# Patient Record
Sex: Female | Born: 1950 | Race: Black or African American | Hispanic: No | Smoking: Former smoker
Health system: Southern US, Community
[De-identification: ages and names within clinical notes are randomized; demographics above are authoritative.]

## PROBLEM LIST (undated history)

## (undated) DIAGNOSIS — M1990 Unspecified osteoarthritis, unspecified site: Secondary | ICD-10-CM

## (undated) DIAGNOSIS — E669 Obesity, unspecified: Secondary | ICD-10-CM

## (undated) DIAGNOSIS — I10 Essential (primary) hypertension: Secondary | ICD-10-CM

## (undated) DIAGNOSIS — H409 Unspecified glaucoma: Secondary | ICD-10-CM

## (undated) HISTORY — PX: COLONOSCOPY: SHX174

## (undated) HISTORY — PX: BREAST BIOPSY: SHX20

## (undated) HISTORY — PX: TONSILLECTOMY: SUR1361

## (undated) HISTORY — PX: APPENDECTOMY: SHX54

## (undated) HISTORY — PX: ABDOMINAL HYSTERECTOMY: SHX81

---

## 2005-12-09 HISTORY — PX: BREAST BIOPSY: SHX20

## 2006-07-07 ENCOUNTER — Ambulatory Visit: Payer: Self-pay | Admitting: Internal Medicine

## 2006-07-09 ENCOUNTER — Ambulatory Visit: Payer: Self-pay

## 2006-09-16 ENCOUNTER — Other Ambulatory Visit: Payer: Self-pay

## 2006-09-19 ENCOUNTER — Ambulatory Visit: Payer: Self-pay | Admitting: Surgery

## 2008-04-05 ENCOUNTER — Ambulatory Visit: Payer: Self-pay

## 2008-04-12 ENCOUNTER — Ambulatory Visit: Payer: Self-pay

## 2009-09-07 ENCOUNTER — Ambulatory Visit: Payer: Self-pay | Admitting: Internal Medicine

## 2009-09-13 ENCOUNTER — Ambulatory Visit: Payer: Self-pay | Admitting: Internal Medicine

## 2012-05-28 ENCOUNTER — Ambulatory Visit: Payer: Self-pay | Admitting: Unknown Physician Specialty

## 2012-05-29 LAB — PATHOLOGY REPORT

## 2015-04-19 DIAGNOSIS — H2513 Age-related nuclear cataract, bilateral: Secondary | ICD-10-CM | POA: Diagnosis not present

## 2015-04-19 DIAGNOSIS — H4011X2 Primary open-angle glaucoma, moderate stage: Secondary | ICD-10-CM | POA: Diagnosis not present

## 2015-05-18 DIAGNOSIS — H4011X2 Primary open-angle glaucoma, moderate stage: Secondary | ICD-10-CM | POA: Diagnosis not present

## 2015-08-23 DIAGNOSIS — R21 Rash and other nonspecific skin eruption: Secondary | ICD-10-CM | POA: Diagnosis not present

## 2015-08-23 DIAGNOSIS — I10 Essential (primary) hypertension: Secondary | ICD-10-CM | POA: Diagnosis not present

## 2015-08-23 DIAGNOSIS — L83 Acanthosis nigricans: Secondary | ICD-10-CM | POA: Diagnosis not present

## 2015-08-30 DIAGNOSIS — R7309 Other abnormal glucose: Secondary | ICD-10-CM | POA: Diagnosis not present

## 2015-08-30 DIAGNOSIS — L83 Acanthosis nigricans: Secondary | ICD-10-CM | POA: Diagnosis not present

## 2015-08-30 DIAGNOSIS — I10 Essential (primary) hypertension: Secondary | ICD-10-CM | POA: Diagnosis not present

## 2015-11-23 DIAGNOSIS — H401111 Primary open-angle glaucoma, right eye, mild stage: Secondary | ICD-10-CM | POA: Diagnosis not present

## 2015-11-23 DIAGNOSIS — H401122 Primary open-angle glaucoma, left eye, moderate stage: Secondary | ICD-10-CM | POA: Diagnosis not present

## 2016-02-14 DIAGNOSIS — I10 Essential (primary) hypertension: Secondary | ICD-10-CM | POA: Diagnosis not present

## 2016-02-14 DIAGNOSIS — E669 Obesity, unspecified: Secondary | ICD-10-CM | POA: Diagnosis not present

## 2016-02-14 DIAGNOSIS — R7301 Impaired fasting glucose: Secondary | ICD-10-CM | POA: Diagnosis not present

## 2016-04-19 DIAGNOSIS — H5210 Myopia, unspecified eye: Secondary | ICD-10-CM | POA: Diagnosis not present

## 2016-04-19 DIAGNOSIS — H524 Presbyopia: Secondary | ICD-10-CM | POA: Diagnosis not present

## 2016-04-22 DIAGNOSIS — H35373 Puckering of macula, bilateral: Secondary | ICD-10-CM | POA: Diagnosis not present

## 2016-08-14 DIAGNOSIS — I10 Essential (primary) hypertension: Secondary | ICD-10-CM | POA: Diagnosis not present

## 2016-08-14 DIAGNOSIS — R7301 Impaired fasting glucose: Secondary | ICD-10-CM | POA: Diagnosis not present

## 2016-08-21 ENCOUNTER — Other Ambulatory Visit: Payer: Self-pay | Admitting: Internal Medicine

## 2016-08-21 DIAGNOSIS — E669 Obesity, unspecified: Secondary | ICD-10-CM | POA: Diagnosis not present

## 2016-08-21 DIAGNOSIS — Z0000 Encounter for general adult medical examination without abnormal findings: Secondary | ICD-10-CM | POA: Diagnosis not present

## 2016-08-21 DIAGNOSIS — H401111 Primary open-angle glaucoma, right eye, mild stage: Secondary | ICD-10-CM | POA: Diagnosis not present

## 2016-08-21 DIAGNOSIS — H401122 Primary open-angle glaucoma, left eye, moderate stage: Secondary | ICD-10-CM | POA: Diagnosis not present

## 2016-08-21 DIAGNOSIS — R922 Inconclusive mammogram: Secondary | ICD-10-CM

## 2016-08-21 DIAGNOSIS — H409 Unspecified glaucoma: Secondary | ICD-10-CM | POA: Diagnosis not present

## 2016-08-21 DIAGNOSIS — Z23 Encounter for immunization: Secondary | ICD-10-CM | POA: Diagnosis not present

## 2016-08-21 DIAGNOSIS — R739 Hyperglycemia, unspecified: Secondary | ICD-10-CM | POA: Diagnosis not present

## 2016-08-21 DIAGNOSIS — I10 Essential (primary) hypertension: Secondary | ICD-10-CM | POA: Diagnosis not present

## 2016-09-12 ENCOUNTER — Ambulatory Visit: Payer: Self-pay | Attending: Internal Medicine

## 2016-09-25 ENCOUNTER — Other Ambulatory Visit: Payer: Self-pay

## 2016-10-07 ENCOUNTER — Encounter: Payer: Self-pay | Admitting: Radiology

## 2016-10-07 ENCOUNTER — Ambulatory Visit
Admission: RE | Admit: 2016-10-07 | Discharge: 2016-10-07 | Disposition: A | Discharge status: Home / Self Care | Payer: Commercial Managed Care - HMO | Source: Ambulatory Visit | Attending: Internal Medicine | Admitting: Internal Medicine

## 2016-10-07 DIAGNOSIS — R922 Inconclusive mammogram: Secondary | ICD-10-CM | POA: Diagnosis not present

## 2016-10-07 DIAGNOSIS — N6012 Diffuse cystic mastopathy of left breast: Secondary | ICD-10-CM | POA: Diagnosis not present

## 2016-10-07 DIAGNOSIS — N6322 Unspecified lump in the left breast, upper inner quadrant: Secondary | ICD-10-CM | POA: Diagnosis not present

## 2017-02-19 DIAGNOSIS — I10 Essential (primary) hypertension: Secondary | ICD-10-CM | POA: Diagnosis not present

## 2017-02-19 DIAGNOSIS — H401111 Primary open-angle glaucoma, right eye, mild stage: Secondary | ICD-10-CM | POA: Diagnosis not present

## 2017-02-19 DIAGNOSIS — R739 Hyperglycemia, unspecified: Secondary | ICD-10-CM | POA: Diagnosis not present

## 2017-02-19 DIAGNOSIS — Z0000 Encounter for general adult medical examination without abnormal findings: Secondary | ICD-10-CM | POA: Diagnosis not present

## 2017-02-19 DIAGNOSIS — H401122 Primary open-angle glaucoma, left eye, moderate stage: Secondary | ICD-10-CM | POA: Diagnosis not present

## 2017-02-26 DIAGNOSIS — R739 Hyperglycemia, unspecified: Secondary | ICD-10-CM | POA: Diagnosis not present

## 2017-02-26 DIAGNOSIS — Z6834 Body mass index (BMI) 34.0-34.9, adult: Secondary | ICD-10-CM | POA: Diagnosis not present

## 2017-02-26 DIAGNOSIS — I10 Essential (primary) hypertension: Secondary | ICD-10-CM | POA: Diagnosis not present

## 2017-07-16 DIAGNOSIS — H401122 Primary open-angle glaucoma, left eye, moderate stage: Secondary | ICD-10-CM | POA: Diagnosis not present

## 2017-07-16 DIAGNOSIS — H524 Presbyopia: Secondary | ICD-10-CM | POA: Diagnosis not present

## 2017-09-24 DIAGNOSIS — Z1231 Encounter for screening mammogram for malignant neoplasm of breast: Secondary | ICD-10-CM | POA: Diagnosis not present

## 2017-09-24 DIAGNOSIS — Z23 Encounter for immunization: Secondary | ICD-10-CM | POA: Diagnosis not present

## 2017-09-24 DIAGNOSIS — Z0000 Encounter for general adult medical examination without abnormal findings: Secondary | ICD-10-CM | POA: Diagnosis not present

## 2017-09-24 DIAGNOSIS — Z6834 Body mass index (BMI) 34.0-34.9, adult: Secondary | ICD-10-CM | POA: Diagnosis not present

## 2017-09-24 DIAGNOSIS — I10 Essential (primary) hypertension: Secondary | ICD-10-CM | POA: Diagnosis not present

## 2017-09-24 DIAGNOSIS — R739 Hyperglycemia, unspecified: Secondary | ICD-10-CM | POA: Diagnosis not present

## 2017-12-17 DIAGNOSIS — H401122 Primary open-angle glaucoma, left eye, moderate stage: Secondary | ICD-10-CM | POA: Diagnosis not present

## 2017-12-17 DIAGNOSIS — H401111 Primary open-angle glaucoma, right eye, mild stage: Secondary | ICD-10-CM | POA: Diagnosis not present

## 2018-03-18 DIAGNOSIS — I10 Essential (primary) hypertension: Secondary | ICD-10-CM | POA: Diagnosis not present

## 2018-03-18 DIAGNOSIS — R7301 Impaired fasting glucose: Secondary | ICD-10-CM | POA: Diagnosis not present

## 2018-03-18 DIAGNOSIS — Z6834 Body mass index (BMI) 34.0-34.9, adult: Secondary | ICD-10-CM | POA: Diagnosis not present

## 2018-04-15 DIAGNOSIS — H401111 Primary open-angle glaucoma, right eye, mild stage: Secondary | ICD-10-CM | POA: Diagnosis not present

## 2018-04-15 DIAGNOSIS — H401122 Primary open-angle glaucoma, left eye, moderate stage: Secondary | ICD-10-CM | POA: Diagnosis not present

## 2018-08-19 DIAGNOSIS — H401122 Primary open-angle glaucoma, left eye, moderate stage: Secondary | ICD-10-CM | POA: Diagnosis not present

## 2018-08-19 DIAGNOSIS — H5203 Hypermetropia, bilateral: Secondary | ICD-10-CM | POA: Diagnosis not present

## 2018-08-27 DIAGNOSIS — S39012A Strain of muscle, fascia and tendon of lower back, initial encounter: Secondary | ICD-10-CM | POA: Diagnosis not present

## 2018-08-27 DIAGNOSIS — W03XXXA Other fall on same level due to collision with another person, initial encounter: Secondary | ICD-10-CM | POA: Diagnosis not present

## 2018-09-23 DIAGNOSIS — R7301 Impaired fasting glucose: Secondary | ICD-10-CM | POA: Diagnosis not present

## 2018-09-23 DIAGNOSIS — I10 Essential (primary) hypertension: Secondary | ICD-10-CM | POA: Diagnosis not present

## 2018-09-30 DIAGNOSIS — Z0000 Encounter for general adult medical examination without abnormal findings: Secondary | ICD-10-CM | POA: Diagnosis not present

## 2018-09-30 DIAGNOSIS — Z23 Encounter for immunization: Secondary | ICD-10-CM | POA: Diagnosis not present

## 2018-09-30 DIAGNOSIS — R739 Hyperglycemia, unspecified: Secondary | ICD-10-CM | POA: Diagnosis not present

## 2018-09-30 DIAGNOSIS — I10 Essential (primary) hypertension: Secondary | ICD-10-CM | POA: Diagnosis not present

## 2018-10-20 DIAGNOSIS — M545 Low back pain: Secondary | ICD-10-CM | POA: Diagnosis not present

## 2018-10-26 DIAGNOSIS — H33321 Round hole, right eye: Secondary | ICD-10-CM | POA: Diagnosis not present

## 2018-11-18 DIAGNOSIS — M545 Low back pain: Secondary | ICD-10-CM | POA: Diagnosis not present

## 2018-11-18 DIAGNOSIS — S32020D Wedge compression fracture of second lumbar vertebra, subsequent encounter for fracture with routine healing: Secondary | ICD-10-CM | POA: Diagnosis not present

## 2018-11-26 DIAGNOSIS — M545 Low back pain: Secondary | ICD-10-CM | POA: Diagnosis not present

## 2018-11-26 DIAGNOSIS — S32020D Wedge compression fracture of second lumbar vertebra, subsequent encounter for fracture with routine healing: Secondary | ICD-10-CM | POA: Diagnosis not present

## 2018-12-10 DIAGNOSIS — S32020D Wedge compression fracture of second lumbar vertebra, subsequent encounter for fracture with routine healing: Secondary | ICD-10-CM | POA: Diagnosis not present

## 2018-12-10 DIAGNOSIS — M545 Low back pain: Secondary | ICD-10-CM | POA: Diagnosis not present

## 2018-12-17 DIAGNOSIS — S32020D Wedge compression fracture of second lumbar vertebra, subsequent encounter for fracture with routine healing: Secondary | ICD-10-CM | POA: Diagnosis not present

## 2018-12-17 DIAGNOSIS — H401122 Primary open-angle glaucoma, left eye, moderate stage: Secondary | ICD-10-CM | POA: Diagnosis not present

## 2018-12-17 DIAGNOSIS — M545 Low back pain: Secondary | ICD-10-CM | POA: Diagnosis not present

## 2018-12-17 DIAGNOSIS — H401111 Primary open-angle glaucoma, right eye, mild stage: Secondary | ICD-10-CM | POA: Diagnosis not present

## 2018-12-23 DIAGNOSIS — S32020D Wedge compression fracture of second lumbar vertebra, subsequent encounter for fracture with routine healing: Secondary | ICD-10-CM | POA: Diagnosis not present

## 2018-12-23 DIAGNOSIS — M545 Low back pain: Secondary | ICD-10-CM | POA: Diagnosis not present

## 2019-01-01 DIAGNOSIS — S32020D Wedge compression fracture of second lumbar vertebra, subsequent encounter for fracture with routine healing: Secondary | ICD-10-CM | POA: Diagnosis not present

## 2019-01-01 DIAGNOSIS — M545 Low back pain: Secondary | ICD-10-CM | POA: Diagnosis not present

## 2019-01-06 DIAGNOSIS — M545 Low back pain: Secondary | ICD-10-CM | POA: Diagnosis not present

## 2019-01-06 DIAGNOSIS — S32020D Wedge compression fracture of second lumbar vertebra, subsequent encounter for fracture with routine healing: Secondary | ICD-10-CM | POA: Diagnosis not present

## 2019-01-13 DIAGNOSIS — S32020D Wedge compression fracture of second lumbar vertebra, subsequent encounter for fracture with routine healing: Secondary | ICD-10-CM | POA: Diagnosis not present

## 2019-01-13 DIAGNOSIS — M545 Low back pain: Secondary | ICD-10-CM | POA: Diagnosis not present

## 2019-01-20 DIAGNOSIS — M545 Low back pain: Secondary | ICD-10-CM | POA: Diagnosis not present

## 2019-01-20 DIAGNOSIS — S32020D Wedge compression fracture of second lumbar vertebra, subsequent encounter for fracture with routine healing: Secondary | ICD-10-CM | POA: Diagnosis not present

## 2019-03-17 DIAGNOSIS — I10 Essential (primary) hypertension: Secondary | ICD-10-CM | POA: Diagnosis not present

## 2019-04-21 DIAGNOSIS — H35373 Puckering of macula, bilateral: Secondary | ICD-10-CM | POA: Diagnosis not present

## 2019-04-21 DIAGNOSIS — H401111 Primary open-angle glaucoma, right eye, mild stage: Secondary | ICD-10-CM | POA: Diagnosis not present

## 2019-04-21 DIAGNOSIS — H401122 Primary open-angle glaucoma, left eye, moderate stage: Secondary | ICD-10-CM | POA: Diagnosis not present

## 2019-08-18 DIAGNOSIS — H524 Presbyopia: Secondary | ICD-10-CM | POA: Diagnosis not present

## 2019-08-18 DIAGNOSIS — H401122 Primary open-angle glaucoma, left eye, moderate stage: Secondary | ICD-10-CM | POA: Diagnosis not present

## 2019-09-29 DIAGNOSIS — R739 Hyperglycemia, unspecified: Secondary | ICD-10-CM | POA: Diagnosis not present

## 2019-09-29 DIAGNOSIS — Z6834 Body mass index (BMI) 34.0-34.9, adult: Secondary | ICD-10-CM | POA: Diagnosis not present

## 2019-09-29 DIAGNOSIS — I10 Essential (primary) hypertension: Secondary | ICD-10-CM | POA: Diagnosis not present

## 2019-10-06 DIAGNOSIS — R7303 Prediabetes: Secondary | ICD-10-CM | POA: Diagnosis not present

## 2019-10-06 DIAGNOSIS — Z87891 Personal history of nicotine dependence: Secondary | ICD-10-CM | POA: Diagnosis not present

## 2019-10-06 DIAGNOSIS — I10 Essential (primary) hypertension: Secondary | ICD-10-CM | POA: Diagnosis not present

## 2019-10-06 DIAGNOSIS — Z0000 Encounter for general adult medical examination without abnormal findings: Secondary | ICD-10-CM | POA: Diagnosis not present

## 2019-11-08 ENCOUNTER — Other Ambulatory Visit: Payer: Self-pay | Admitting: Internal Medicine

## 2019-11-08 DIAGNOSIS — Z1231 Encounter for screening mammogram for malignant neoplasm of breast: Secondary | ICD-10-CM

## 2019-11-17 ENCOUNTER — Ambulatory Visit
Admission: RE | Admit: 2019-11-17 | Discharge: 2019-11-17 | Disposition: A | Discharge status: Home / Self Care | Payer: Medicare HMO | Source: Ambulatory Visit | Attending: Internal Medicine | Admitting: Internal Medicine

## 2019-11-17 DIAGNOSIS — Z1231 Encounter for screening mammogram for malignant neoplasm of breast: Secondary | ICD-10-CM | POA: Diagnosis not present

## 2019-11-18 ENCOUNTER — Other Ambulatory Visit: Payer: Self-pay | Admitting: Internal Medicine

## 2019-11-18 DIAGNOSIS — N6310 Unspecified lump in the right breast, unspecified quadrant: Secondary | ICD-10-CM

## 2019-11-18 DIAGNOSIS — R928 Other abnormal and inconclusive findings on diagnostic imaging of breast: Secondary | ICD-10-CM

## 2019-12-06 ENCOUNTER — Ambulatory Visit
Admission: RE | Admit: 2019-12-06 | Discharge: 2019-12-06 | Disposition: A | Discharge status: Home / Self Care | Payer: Medicare HMO | Source: Ambulatory Visit | Attending: Internal Medicine | Admitting: Internal Medicine

## 2019-12-06 DIAGNOSIS — N6001 Solitary cyst of right breast: Secondary | ICD-10-CM | POA: Diagnosis not present

## 2019-12-06 DIAGNOSIS — R928 Other abnormal and inconclusive findings on diagnostic imaging of breast: Secondary | ICD-10-CM | POA: Insufficient documentation

## 2019-12-06 DIAGNOSIS — N6310 Unspecified lump in the right breast, unspecified quadrant: Secondary | ICD-10-CM

## 2019-12-06 DIAGNOSIS — N6313 Unspecified lump in the right breast, lower outer quadrant: Secondary | ICD-10-CM | POA: Diagnosis not present

## 2019-12-09 ENCOUNTER — Other Ambulatory Visit: Payer: Self-pay | Admitting: Internal Medicine

## 2019-12-09 DIAGNOSIS — R928 Other abnormal and inconclusive findings on diagnostic imaging of breast: Secondary | ICD-10-CM

## 2019-12-09 DIAGNOSIS — N6310 Unspecified lump in the right breast, unspecified quadrant: Secondary | ICD-10-CM

## 2019-12-14 ENCOUNTER — Ambulatory Visit
Admission: RE | Admit: 2019-12-14 | Discharge: 2019-12-14 | Disposition: A | Discharge status: Home / Self Care | Payer: Medicare HMO | Source: Ambulatory Visit | Attending: Internal Medicine | Admitting: Internal Medicine

## 2019-12-14 DIAGNOSIS — R928 Other abnormal and inconclusive findings on diagnostic imaging of breast: Secondary | ICD-10-CM | POA: Diagnosis not present

## 2019-12-14 DIAGNOSIS — N6081 Other benign mammary dysplasias of right breast: Secondary | ICD-10-CM | POA: Diagnosis not present

## 2019-12-14 DIAGNOSIS — N6311 Unspecified lump in the right breast, upper outer quadrant: Secondary | ICD-10-CM | POA: Diagnosis not present

## 2019-12-14 DIAGNOSIS — N6310 Unspecified lump in the right breast, unspecified quadrant: Secondary | ICD-10-CM | POA: Insufficient documentation

## 2019-12-14 HISTORY — PX: BREAST BIOPSY: SHX20

## 2019-12-15 LAB — SURGICAL PATHOLOGY

## 2020-01-21 ENCOUNTER — Ambulatory Visit: Payer: Medicare HMO | Attending: Internal Medicine

## 2020-01-21 DIAGNOSIS — R112 Nausea with vomiting, unspecified: Secondary | ICD-10-CM | POA: Diagnosis not present

## 2020-01-21 DIAGNOSIS — Z9889 Other specified postprocedural states: Secondary | ICD-10-CM | POA: Diagnosis not present

## 2020-01-21 DIAGNOSIS — Z23 Encounter for immunization: Secondary | ICD-10-CM

## 2020-01-21 DIAGNOSIS — Z86010 Personal history of colonic polyps: Secondary | ICD-10-CM | POA: Diagnosis not present

## 2020-01-21 NOTE — Progress Notes (Signed)
   Covid-19 Vaccination Clinic  Name:  Alejandra Villanueva    MRN: [id number] DOB: 09/14/1951  01/21/2020  [name] was observed post Covid-19 immunization for 15 minutes without incidence. She was provided with Vaccine Information Sheet and instruction to access the V-Safe system.   [name] was instructed to call 911 with any severe reactions post vaccine: Marland Kitchen Difficulty breathing  . Swelling of your face and throat  . A fast heartbeat  . A bad rash all over your body  . Dizziness and weakness    Immunizations Administered    Name Date Dose VIS Date Route   Pfizer COVID-19 Vaccine 01/21/2020  9:57 AM 0.3 mL 11/19/2019 Intramuscular   Manufacturer: [place]   Lot: [id number]   [place]: [id number]

## 2020-02-15 ENCOUNTER — Ambulatory Visit: Payer: Medicare HMO | Attending: Internal Medicine

## 2020-02-15 DIAGNOSIS — Z23 Encounter for immunization: Secondary | ICD-10-CM | POA: Insufficient documentation

## 2020-02-15 NOTE — Progress Notes (Signed)
   Covid-19 Vaccination Clinic  Name:  Alejandra Villanueva    MRN: [id number] DOB: March 06, 1951  02/15/2020  [name] was observed post Covid-19 immunization for 15 minutes without incident. She was provided with Vaccine Information Sheet and instruction to access the V-Safe system.   [name] was instructed to call 911 with any severe reactions post vaccine: Marland Kitchen Difficulty breathing  . Swelling of face and throat  . A fast heartbeat  . A bad rash all over body  . Dizziness and weakness   Immunizations Administered    Name Date Dose VIS Date Route   Pfizer COVID-19 Vaccine 02/15/2020 11:07 AM 0.3 mL 11/19/2019 Intramuscular   Manufacturer: [place]   Lot: [id number]   [place]: [id number]

## 2020-03-29 DIAGNOSIS — R7303 Prediabetes: Secondary | ICD-10-CM | POA: Diagnosis not present

## 2020-03-29 DIAGNOSIS — I10 Essential (primary) hypertension: Secondary | ICD-10-CM | POA: Diagnosis not present

## 2020-04-05 DIAGNOSIS — Z87891 Personal history of nicotine dependence: Secondary | ICD-10-CM | POA: Diagnosis not present

## 2020-04-05 DIAGNOSIS — R7303 Prediabetes: Secondary | ICD-10-CM | POA: Diagnosis not present

## 2020-04-05 DIAGNOSIS — I10 Essential (primary) hypertension: Secondary | ICD-10-CM | POA: Diagnosis not present

## 2020-05-18 ENCOUNTER — Other Ambulatory Visit
Admission: RE | Admit: 2020-05-18 | Discharge: 2020-05-18 | Disposition: A | Discharge status: Home / Self Care | Payer: Medicare HMO | Source: Ambulatory Visit | Attending: Internal Medicine | Admitting: Internal Medicine

## 2020-05-18 ENCOUNTER — Other Ambulatory Visit: Payer: Self-pay

## 2020-05-18 DIAGNOSIS — Z01812 Encounter for preprocedural laboratory examination: Secondary | ICD-10-CM | POA: Insufficient documentation

## 2020-05-18 DIAGNOSIS — Z20822 Contact with and (suspected) exposure to covid-19: Secondary | ICD-10-CM | POA: Insufficient documentation

## 2020-05-19 ENCOUNTER — Encounter: Payer: Self-pay | Admitting: Internal Medicine

## 2020-05-19 LAB — SARS CORONAVIRUS 2 (TAT 6-24 HRS): SARS Coronavirus 2: NEGATIVE

## 2020-05-22 ENCOUNTER — Encounter
Admission: RE | Disposition: A | Discharge status: Home / Self Care | Payer: Self-pay | Source: Home / Self Care | Attending: Internal Medicine

## 2020-05-22 ENCOUNTER — Encounter: Payer: Self-pay | Admitting: Internal Medicine

## 2020-05-22 ENCOUNTER — Other Ambulatory Visit: Payer: Self-pay

## 2020-05-22 ENCOUNTER — Ambulatory Visit: Payer: Medicare HMO | Admitting: Anesthesiology

## 2020-05-22 ENCOUNTER — Ambulatory Visit
Admission: RE | Admit: 2020-05-22 | Discharge: 2020-05-22 | Disposition: A | Discharge status: Home / Self Care | Payer: Medicare HMO | Attending: Internal Medicine | Admitting: Internal Medicine

## 2020-05-22 DIAGNOSIS — Z1211 Encounter for screening for malignant neoplasm of colon: Secondary | ICD-10-CM | POA: Diagnosis not present

## 2020-05-22 DIAGNOSIS — Z888 Allergy status to other drugs, medicaments and biological substances status: Secondary | ICD-10-CM | POA: Diagnosis not present

## 2020-05-22 DIAGNOSIS — K621 Rectal polyp: Secondary | ICD-10-CM | POA: Insufficient documentation

## 2020-05-22 DIAGNOSIS — M1990 Unspecified osteoarthritis, unspecified site: Secondary | ICD-10-CM | POA: Diagnosis not present

## 2020-05-22 DIAGNOSIS — Z79899 Other long term (current) drug therapy: Secondary | ICD-10-CM | POA: Diagnosis not present

## 2020-05-22 DIAGNOSIS — K648 Other hemorrhoids: Secondary | ICD-10-CM | POA: Diagnosis not present

## 2020-05-22 DIAGNOSIS — K5730 Diverticulosis of large intestine without perforation or abscess without bleeding: Secondary | ICD-10-CM | POA: Diagnosis not present

## 2020-05-22 DIAGNOSIS — Z87891 Personal history of nicotine dependence: Secondary | ICD-10-CM | POA: Diagnosis not present

## 2020-05-22 DIAGNOSIS — K6389 Other specified diseases of intestine: Secondary | ICD-10-CM | POA: Insufficient documentation

## 2020-05-22 DIAGNOSIS — Z86010 Personal history of colonic polyps: Secondary | ICD-10-CM | POA: Diagnosis not present

## 2020-05-22 DIAGNOSIS — D128 Benign neoplasm of rectum: Secondary | ICD-10-CM | POA: Diagnosis not present

## 2020-05-22 DIAGNOSIS — E669 Obesity, unspecified: Secondary | ICD-10-CM | POA: Insufficient documentation

## 2020-05-22 DIAGNOSIS — K640 First degree hemorrhoids: Secondary | ICD-10-CM | POA: Diagnosis not present

## 2020-05-22 DIAGNOSIS — I10 Essential (primary) hypertension: Secondary | ICD-10-CM | POA: Insufficient documentation

## 2020-05-22 DIAGNOSIS — H409 Unspecified glaucoma: Secondary | ICD-10-CM | POA: Diagnosis not present

## 2020-05-22 DIAGNOSIS — Z885 Allergy status to narcotic agent status: Secondary | ICD-10-CM | POA: Insufficient documentation

## 2020-05-22 HISTORY — DX: Unspecified osteoarthritis, unspecified site: M19.90

## 2020-05-22 HISTORY — DX: Obesity, unspecified: E66.9

## 2020-05-22 HISTORY — DX: Essential (primary) hypertension: I10

## 2020-05-22 HISTORY — PX: COLONOSCOPY WITH PROPOFOL: SHX5780

## 2020-05-22 HISTORY — DX: Unspecified glaucoma: H40.9

## 2020-05-22 SURGERY — COLONOSCOPY WITH PROPOFOL
Anesthesia: General

## 2020-05-22 MED ORDER — PROPOFOL 500 MG/50ML IV EMUL
INTRAVENOUS | Status: AC
  Filled 2020-05-22: qty 50

## 2020-05-22 MED ORDER — LIDOCAINE HCL (CARDIAC) PF 100 MG/5ML IV SOSY
PREFILLED_SYRINGE | INTRAVENOUS | Status: DC | PRN
  Administered 2020-05-22: 60 mg via INTRATRACHEAL

## 2020-05-22 MED ORDER — SODIUM CHLORIDE 0.9 % IV SOLN: INTRAVENOUS | Status: DC

## 2020-05-22 MED ORDER — PROPOFOL 500 MG/50ML IV EMUL
INTRAVENOUS | Status: DC | PRN
  Administered 2020-05-22: 150 ug/kg/min via INTRAVENOUS

## 2020-05-22 MED ORDER — LIDOCAINE HCL (PF) 2 % IJ SOLN
INTRAMUSCULAR | Status: AC
  Filled 2020-05-22: qty 10

## 2020-05-22 MED ORDER — PROPOFOL 10 MG/ML IV BOLUS
INTRAVENOUS | Status: DC | PRN
  Administered 2020-05-22: 10 mg via INTRAVENOUS
  Administered 2020-05-22: 20 mg via INTRAVENOUS
  Administered 2020-05-22: 10 mg via INTRAVENOUS
  Administered 2020-05-22: 50 mg via INTRAVENOUS

## 2020-05-22 NOTE — Transfer of Care (Signed)
Immediate Anesthesia Transfer of Care Note  Patient: Alejandra Villanueva  Procedure(s) Performed: COLONOSCOPY WITH PROPOFOL (N/A )  Patient Location: PACU and Endoscopy Unit  Anesthesia Type:General  Level of Consciousness: sedated  Airway & Oxygen Therapy: Patient Spontanous Breathing  Post-op Assessment: Report given to RN and Post -op Vital signs reviewed and stable  Post vital signs: Reviewed and stable  Last Vitals:  Vitals Value Taken Time  BP    Temp    Pulse    Resp    SpO2      Last Pain:  Vitals:   05/22/20 1013  TempSrc: Temporal  PainSc: 0-No pain         Complications: No complications documented.

## 2020-05-22 NOTE — Op Note (Signed)
[facility] Patient Name: Alejandra Villanueva Procedure Date: 05/22/2020 11:05 AM MRN: [id number] Account #: [id number] Date of Birth: 07/02/1951 Admit Type: Outpatient Age: 69 Room: [facility] ENDO ROOM 3 Gender: Female Note Status: Finalized Procedure:             Colonoscopy Indications:           Surveillance: Personal history of adenomatous polyps                         on last colonoscopy > 5 years ago Providers:             Lorie Apley K. Alice Reichert MD, MD Referring MD:          Frazier Richards III, M.D. Medicines:             Propofol per Anesthesia Complications:         No immediate complications. Procedure:             Pre-Anesthesia Assessment:                        - The risks and benefits of the procedure and the                         sedation options and risks were discussed with the                         patient. All questions were answered and informed                         consent was obtained.                        - Patient identification and proposed procedure were                         verified prior to the procedure by the nurse. The                         procedure was verified in the procedure room.                        - ASA Grade Assessment: III - A patient with severe                         systemic disease.                        - After reviewing the risks and benefits, the patient                         was deemed in satisfactory condition to undergo the                         procedure.                        After obtaining informed consent, the colonoscope was                         passed under direct vision. Throughout  the procedure,                         the patient's blood pressure, pulse, and oxygen                         saturations were monitored continuously. The                         Colonoscope was introduced through the anus and                         advanced to the the cecum, identified by  appendiceal                         orifice and ileocecal valve. The colonoscopy was                         somewhat difficult due to significant looping.                         Successful completion of the procedure was aided by                         changing the patient to a prone position. The patient                         tolerated the procedure fairly well. The quality of                         the bowel preparation was adequate. The ileocecal                         valve, appendiceal orifice, and rectum were                         photographed. Findings:      The perianal and digital rectal examinations were normal. Pertinent       negatives include normal sphincter tone and no palpable rectal lesions.      Many small and large-mouthed diverticula were found in the sigmoid colon.      A 4 mm polyp was found in the rectum. The polyp was sessile. The polyp       was removed with a jumbo cold forceps. Resection and retrieval were       complete.      A diffuse area of mild melanosis was found in the entire colon.      Non-bleeding internal hemorrhoids were found during retroflexion. The       hemorrhoids were Grade I (internal hemorrhoids that do not prolapse).      The exam was otherwise without abnormality. Impression:            - Diverticulosis in the sigmoid colon.                        - One 4 mm polyp in the rectum, removed with a jumbo                         cold forceps. Resected and retrieved.                        -  Melanosis in the colon.                        - Non-bleeding internal hemorrhoids.                        - The examination was otherwise normal. Recommendation:        - Patient has a contact number available for                         emergencies. The signs and symptoms of potential                         delayed complications were discussed with the patient.                         Return to normal activities tomorrow. Written                          discharge instructions were provided to the patient.                        - Resume previous diet.                        - Continue present medications.                        - Await pathology results.                        - Repeat colonoscopy in 5 years for surveillance.                        - Return to GI office PRN.                        - The findings and recommendations were discussed with                         the patient. Procedure Code(s):     --- Professional ---                        [phone], Colonoscopy, flexible; with biopsy, single or                         multiple Diagnosis Code(s):     --- Professional ---                        K57.30, Diverticulosis of large intestine without                         perforation or abscess without bleeding                        K63.89, Other specified diseases of intestine                        K62.1, Rectal polyp  K64.0, First degree hemorrhoids                        Z86.010, Personal history of colonic polyps CPT copyright 2019 American Medical Association. All rights reserved. The codes documented in this report are preliminary and upon coder review may  be revised to meet current compliance requirements. Attending Participation:      I personally performed the entire procedure. Efrain Sella MD, MD 05/22/2020 11:48:25 AM This report has been signed electronically. Number of Addenda: 0 Note Initiated On: 05/22/2020 11:05 AM Scope Withdrawal Time: 0 hours 7 minutes 31 seconds  Total Procedure Duration: 0 hours 18 minutes 36 seconds  Estimated Blood Loss:  Estimated blood loss: none.      [facility]

## 2020-05-22 NOTE — Anesthesia Postprocedure Evaluation (Signed)
Anesthesia Post Note  Patient: Alejandra Villanueva  Procedure(s) Performed: COLONOSCOPY WITH PROPOFOL (N/A )  Patient location during evaluation: Endoscopy Anesthesia Type: General Level of consciousness: awake and alert Pain management: pain level controlled Vital Signs Assessment: post-procedure vital signs reviewed and stable Respiratory status: spontaneous breathing and respiratory function stable Cardiovascular status: stable Anesthetic complications: no   No complications documented.   Last Vitals:  Vitals:   05/22/20 1157 05/22/20 1207  BP: 123/87 139/85  Pulse: 80 69  Resp: 20 (!) 23  Temp:    SpO2: 99% 100%    Last Pain:  Vitals:   05/22/20 1207  TempSrc:   PainSc: 0-No pain                 Alejandra Villanueva K

## 2020-05-22 NOTE — Interval H&P Note (Signed)
History and Physical Interval Note:  05/22/2020 11:08 AM  Alejandra Villanueva  has presented today for surgery, with the diagnosis of PERSONAL HX.OF COLON POLYPS.  The various methods of treatment have been discussed with the patient and family. After consideration of risks, benefits and other options for treatment, the patient has consented to  Procedure(s): COLONOSCOPY WITH PROPOFOL (N/A) as a surgical intervention.  The patient's history has been reviewed, patient examined, no change in status, stable for surgery.  I have reviewed the patient's chart and labs.  Questions were answered to the patient's satisfaction.     [place], [place]

## 2020-05-22 NOTE — H&P (Signed)
Outpatient short stay form Pre-procedure 05/22/2020 11:07 AM Alejandra Villanueva K. Alice Reichert, M.D.  Primary Physician: Frazier Richards III, M.D.  Reason for visit:  Personal hx of multiple adenomatous colon polyps - 2013.  History of present illness:                           Patient presents for colonoscopy for a personal hx of colon polyps. The patient denies abdominal pain, abnormal weight loss or rectal bleeding.      Current Facility-Administered Medications:  .  0.9 %  sodium chloride infusion, , Intravenous, Continuous, [place], Benay Pike, MD, Last Rate: 20 mL/hr at 05/22/20 1043, New Bag at 05/22/20 1043  Medications Prior to Admission  Medication Sig Dispense Refill Last Dose  . amLODipine (NORVASC) 10 MG tablet Take 10 mg by mouth daily.   05/22/2020 at Unknown time  . latanoprost (XALATAN) 0.005 % ophthalmic solution Place 1 drop into both eyes at bedtime.   05/21/2020 at Unknown time  . ondansetron (ZOFRAN) 4 MG tablet Take 4 mg by mouth every 8 (eight) hours as needed for nausea or vomiting.     . timolol (TIMOPTIC) 0.5 % ophthalmic solution Place 1 drop into both eyes 2 (two) times daily.   05/21/2020 at Unknown time     Allergies  Allergen Reactions  . Codeine Nausea Only  . Tramadol Other (See Comments)    hallucination     Past Medical History:  Diagnosis Date  . Arthritis   . Glaucoma   . Hypertension   . Obesity     Review of systems:  Otherwise negative.    Physical Exam  Gen: Alert, oriented. Appears stated age.  HEENT: [place]/AT. PERRLA. Lungs: CTA, no wheezes. CV: RR nl S1, S2. Abd: soft, benign, no masses. BS+ Ext: No edema. Pulses 2+    Planned procedures: Proceed with colonoscopy. The patient understands the nature of the planned procedure, indications, risks, alternatives and potential complications including but not limited to bleeding, infection, perforation, damage to internal organs and possible oversedation/side effects from anesthesia. The patient  agrees and gives consent to proceed.  Please refer to procedure notes for findings, recommendations and patient disposition/instructions.     Ionna Avis K. Alice Reichert, M.D. Gastroenterology 05/22/2020  11:07 AM

## 2020-05-22 NOTE — Anesthesia Preprocedure Evaluation (Signed)
Anesthesia Evaluation  Patient identified by MRN, date of birth, ID band Patient awake    Reviewed: Allergy & Precautions, NPO status , Patient's Chart, lab work & pertinent test results  History of Anesthesia Complications Negative for: history of anesthetic complications  Airway Mallampati: III       Dental   Pulmonary neg sleep apnea, neg COPD, Not current smoker, former smoker,           Cardiovascular hypertension, Pt. on medications (-) Past MI and (-) CHF (-) dysrhythmias (-) Valvular Problems/Murmurs     Neuro/Psych neg Seizures    GI/Hepatic Neg liver ROS, neg GERD  ,  Endo/Other  neg diabetes  Renal/GU negative Renal ROS     Musculoskeletal   Abdominal   Peds  Hematology   Anesthesia Other Findings   Reproductive/Obstetrics                             Anesthesia Physical Anesthesia Plan  ASA: II  Anesthesia Plan: General   Post-op Pain Management:    Induction: Intravenous  PONV Risk Score and Plan: 3 and Propofol infusion, TIVA and Treatment may vary due to age or medical condition  Airway Management Planned: Nasal Cannula  Additional Equipment:   Intra-op Plan:   Post-operative Plan:   Informed Consent: I have reviewed the patients History and Physical, chart, labs and discussed the procedure including the risks, benefits and alternatives for the proposed anesthesia with the patient or authorized representative who has indicated his/her understanding and acceptance.       Plan Discussed with:   Anesthesia Plan Comments:         Anesthesia Quick Evaluation

## 2020-05-23 ENCOUNTER — Encounter: Payer: Self-pay | Admitting: Internal Medicine

## 2020-05-23 LAB — SURGICAL PATHOLOGY

## 2020-06-14 DIAGNOSIS — Z87891 Personal history of nicotine dependence: Secondary | ICD-10-CM | POA: Diagnosis not present

## 2020-06-14 DIAGNOSIS — N951 Menopausal and female climacteric states: Secondary | ICD-10-CM | POA: Diagnosis not present

## 2020-08-21 DIAGNOSIS — H5213 Myopia, bilateral: Secondary | ICD-10-CM | POA: Diagnosis not present

## 2020-10-11 DIAGNOSIS — I10 Essential (primary) hypertension: Secondary | ICD-10-CM | POA: Diagnosis not present

## 2020-10-11 DIAGNOSIS — R7303 Prediabetes: Secondary | ICD-10-CM | POA: Diagnosis not present

## 2020-10-18 DIAGNOSIS — R7303 Prediabetes: Secondary | ICD-10-CM | POA: Diagnosis not present

## 2020-10-18 DIAGNOSIS — I10 Essential (primary) hypertension: Secondary | ICD-10-CM | POA: Diagnosis not present

## 2020-10-18 DIAGNOSIS — Z0000 Encounter for general adult medical examination without abnormal findings: Secondary | ICD-10-CM | POA: Diagnosis not present

## 2020-10-18 DIAGNOSIS — Z23 Encounter for immunization: Secondary | ICD-10-CM | POA: Diagnosis not present

## 2020-12-20 DIAGNOSIS — H401111 Primary open-angle glaucoma, right eye, mild stage: Secondary | ICD-10-CM | POA: Diagnosis not present

## 2020-12-20 DIAGNOSIS — H401122 Primary open-angle glaucoma, left eye, moderate stage: Secondary | ICD-10-CM | POA: Diagnosis not present

## 2021-05-02 DIAGNOSIS — I10 Essential (primary) hypertension: Secondary | ICD-10-CM | POA: Diagnosis not present

## 2021-05-02 DIAGNOSIS — R7303 Prediabetes: Secondary | ICD-10-CM | POA: Diagnosis not present

## 2021-05-02 DIAGNOSIS — Z6834 Body mass index (BMI) 34.0-34.9, adult: Secondary | ICD-10-CM | POA: Diagnosis not present

## 2021-05-09 DIAGNOSIS — I10 Essential (primary) hypertension: Secondary | ICD-10-CM | POA: Diagnosis not present

## 2021-05-09 DIAGNOSIS — R7303 Prediabetes: Secondary | ICD-10-CM | POA: Diagnosis not present

## 2021-08-20 IMAGING — MG DIGITAL SCREENING BILAT W/ TOMO W/ CAD
6 of 10 series · 6 of 30 positions shown · non-contrast
Comparison: Previous exam(s).

CLINICAL DATA: Screening.

EXAM:
DIGITAL SCREENING BILATERAL MAMMOGRAM WITH TOMO AND CAD

[R XCCL synth-2D]
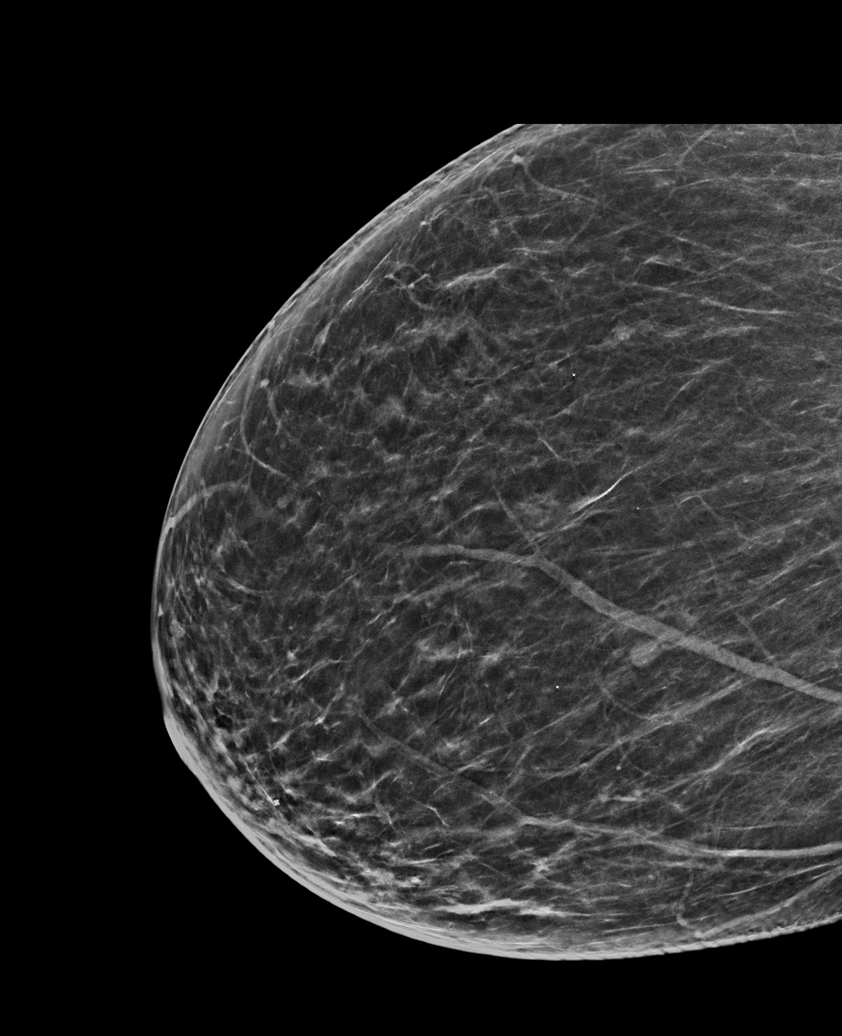

[R MLO synth-2D]
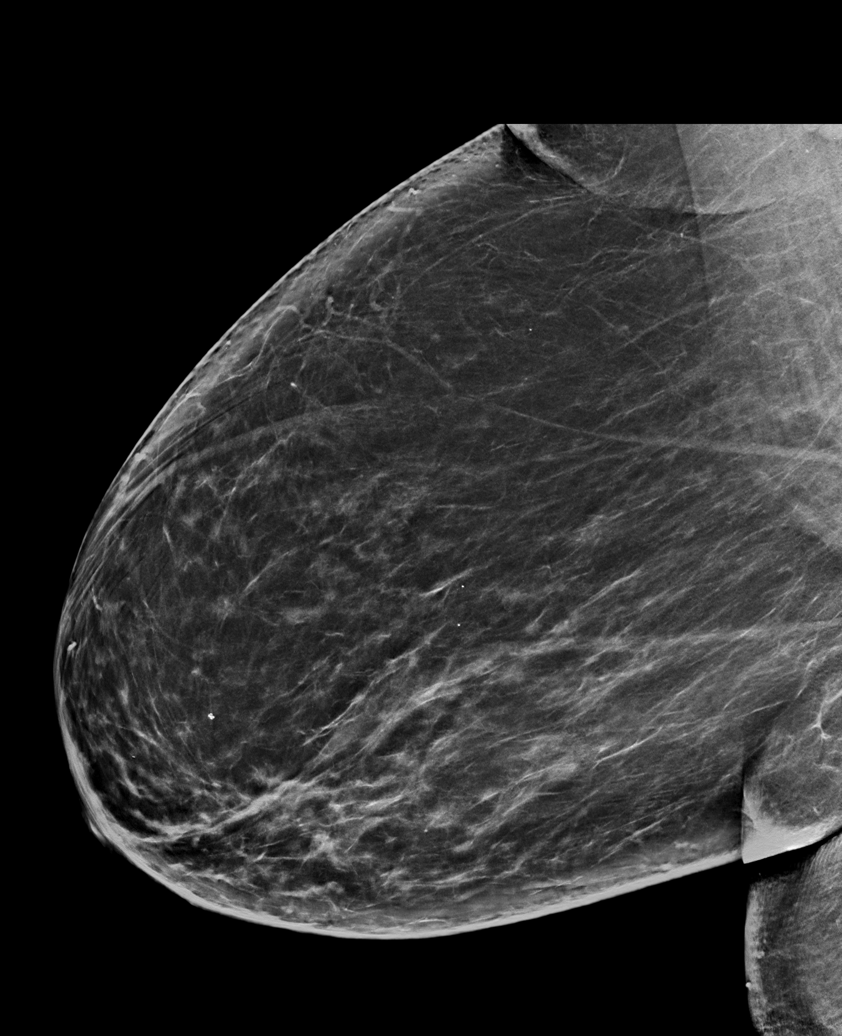

[L MLO synth-2D]
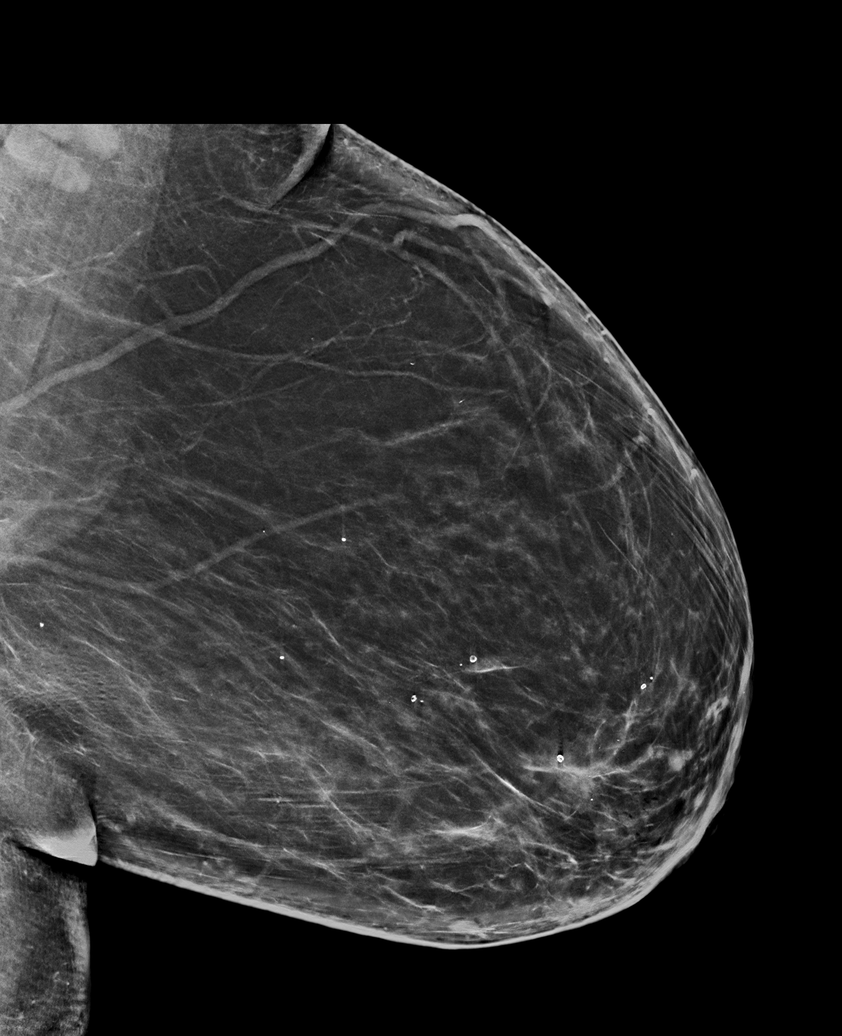

[R CC synth-2D]
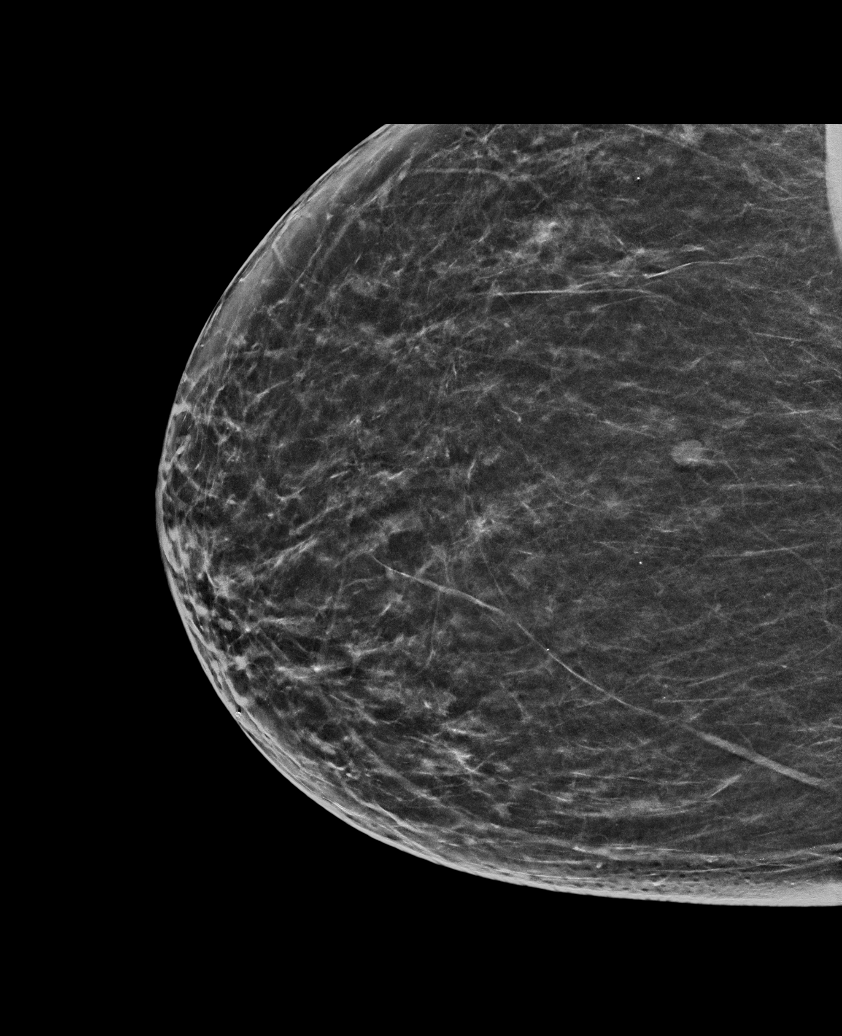

[L CC synth-2D]
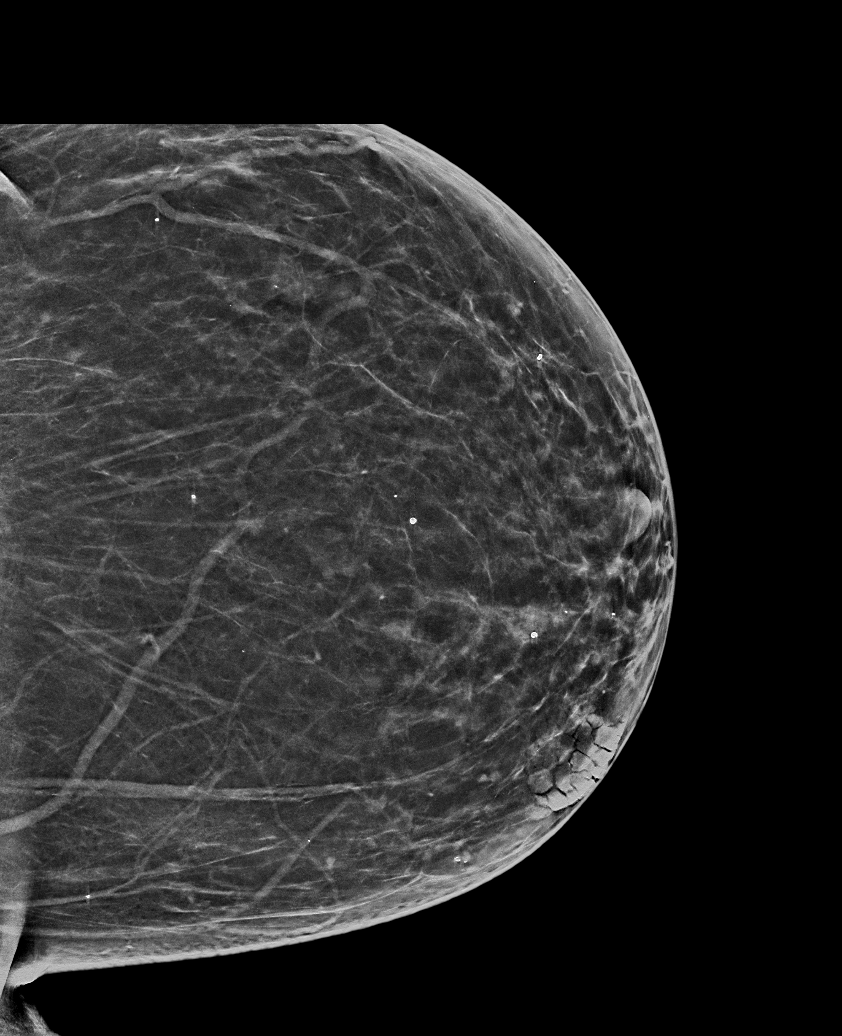

[R MLO tomo · tomo slice 43/85.0]
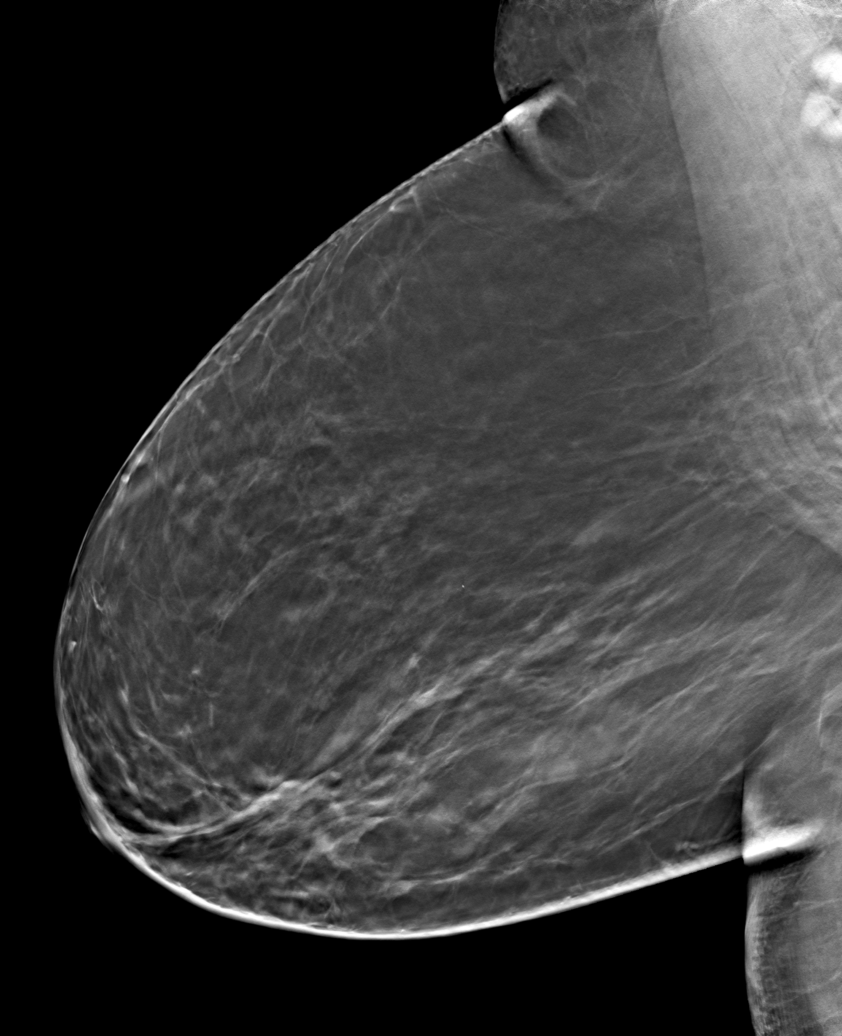

[6 of 30 positions shown; findings below may reference images not displayed]

ACR Breast Density Category b: There are scattered areas of
fibroglandular density.
FINDINGS: In the right breast, a possible mass warrants further evaluation. In
the left breast, no findings suspicious for malignancy. Images were
processed with CAD.
IMPRESSION: Further evaluation is suggested for possible mass in the right
breast.

RECOMMENDATION:
Diagnostic mammogram and possibly ultrasound of the right breast.
(Code:T1-A-550)

The patient will be contacted regarding the findings, and additional
imaging will be scheduled.

BI-RADS CATEGORY  0: Incomplete. Need additional imaging evaluation
and/or prior mammograms for comparison.

## 2021-10-02 DIAGNOSIS — M79641 Pain in right hand: Secondary | ICD-10-CM | POA: Diagnosis not present

## 2021-10-02 DIAGNOSIS — I10 Essential (primary) hypertension: Secondary | ICD-10-CM | POA: Diagnosis not present

## 2021-10-02 DIAGNOSIS — R7303 Prediabetes: Secondary | ICD-10-CM | POA: Diagnosis not present

## 2021-10-22 DIAGNOSIS — H524 Presbyopia: Secondary | ICD-10-CM | POA: Diagnosis not present

## 2021-11-06 DIAGNOSIS — M79641 Pain in right hand: Secondary | ICD-10-CM | POA: Diagnosis not present

## 2021-11-06 DIAGNOSIS — R7303 Prediabetes: Secondary | ICD-10-CM | POA: Diagnosis not present

## 2021-11-06 DIAGNOSIS — Z6834 Body mass index (BMI) 34.0-34.9, adult: Secondary | ICD-10-CM | POA: Diagnosis not present

## 2021-11-06 DIAGNOSIS — I10 Essential (primary) hypertension: Secondary | ICD-10-CM | POA: Diagnosis not present

## 2021-11-13 DIAGNOSIS — Z0000 Encounter for general adult medical examination without abnormal findings: Secondary | ICD-10-CM | POA: Diagnosis not present

## 2021-11-13 DIAGNOSIS — I10 Essential (primary) hypertension: Secondary | ICD-10-CM | POA: Diagnosis not present

## 2021-11-13 DIAGNOSIS — Z6834 Body mass index (BMI) 34.0-34.9, adult: Secondary | ICD-10-CM | POA: Diagnosis not present

## 2021-11-13 DIAGNOSIS — R7303 Prediabetes: Secondary | ICD-10-CM | POA: Diagnosis not present

## 2021-11-13 DIAGNOSIS — Z1231 Encounter for screening mammogram for malignant neoplasm of breast: Secondary | ICD-10-CM | POA: Diagnosis not present

## 2021-11-13 DIAGNOSIS — Z1389 Encounter for screening for other disorder: Secondary | ICD-10-CM | POA: Diagnosis not present

## 2021-11-13 DIAGNOSIS — Z23 Encounter for immunization: Secondary | ICD-10-CM | POA: Diagnosis not present

## 2021-11-29 ENCOUNTER — Other Ambulatory Visit: Payer: Self-pay | Admitting: Internal Medicine

## 2021-11-29 ENCOUNTER — Other Ambulatory Visit: Payer: Self-pay | Admitting: Sleep Medicine

## 2021-12-13 ENCOUNTER — Other Ambulatory Visit: Payer: Self-pay | Admitting: Internal Medicine

## 2021-12-13 DIAGNOSIS — N6310 Unspecified lump in the right breast, unspecified quadrant: Secondary | ICD-10-CM

## 2022-04-29 DIAGNOSIS — H401122 Primary open-angle glaucoma, left eye, moderate stage: Secondary | ICD-10-CM | POA: Diagnosis not present

## 2022-04-29 DIAGNOSIS — H401111 Primary open-angle glaucoma, right eye, mild stage: Secondary | ICD-10-CM | POA: Diagnosis not present

## 2022-05-08 DIAGNOSIS — Z6834 Body mass index (BMI) 34.0-34.9, adult: Secondary | ICD-10-CM | POA: Diagnosis not present

## 2022-05-08 DIAGNOSIS — I10 Essential (primary) hypertension: Secondary | ICD-10-CM | POA: Diagnosis not present

## 2022-05-08 DIAGNOSIS — R7303 Prediabetes: Secondary | ICD-10-CM | POA: Diagnosis not present

## 2022-05-09 DIAGNOSIS — Z1159 Encounter for screening for other viral diseases: Secondary | ICD-10-CM | POA: Diagnosis not present

## 2022-05-09 DIAGNOSIS — Z7960 Long term (current) use of unspecified immunomodulators and immunosuppressants: Secondary | ICD-10-CM | POA: Diagnosis not present

## 2022-05-09 DIAGNOSIS — M064 Inflammatory polyarthropathy: Secondary | ICD-10-CM | POA: Diagnosis not present

## 2022-05-15 DIAGNOSIS — I10 Essential (primary) hypertension: Secondary | ICD-10-CM | POA: Diagnosis not present

## 2022-05-15 DIAGNOSIS — R7303 Prediabetes: Secondary | ICD-10-CM | POA: Diagnosis not present

## 2022-05-15 DIAGNOSIS — Z6827 Body mass index (BMI) 27.0-27.9, adult: Secondary | ICD-10-CM | POA: Diagnosis not present

## 2022-05-15 DIAGNOSIS — M0579 Rheumatoid arthritis with rheumatoid factor of multiple sites without organ or systems involvement: Secondary | ICD-10-CM | POA: Diagnosis not present

## 2022-06-20 DIAGNOSIS — M0579 Rheumatoid arthritis with rheumatoid factor of multiple sites without organ or systems involvement: Secondary | ICD-10-CM | POA: Diagnosis not present

## 2022-06-20 DIAGNOSIS — Z7960 Long term (current) use of unspecified immunomodulators and immunosuppressants: Secondary | ICD-10-CM | POA: Diagnosis not present

## 2022-08-07 DIAGNOSIS — Z7960 Long term (current) use of unspecified immunomodulators and immunosuppressants: Secondary | ICD-10-CM | POA: Diagnosis not present

## 2022-08-07 DIAGNOSIS — M0579 Rheumatoid arthritis with rheumatoid factor of multiple sites without organ or systems involvement: Secondary | ICD-10-CM | POA: Diagnosis not present

## 2022-08-07 DIAGNOSIS — Z111 Encounter for screening for respiratory tuberculosis: Secondary | ICD-10-CM | POA: Diagnosis not present

## 2022-11-11 DIAGNOSIS — Z6834 Body mass index (BMI) 34.0-34.9, adult: Secondary | ICD-10-CM | POA: Diagnosis not present

## 2022-11-11 DIAGNOSIS — I10 Essential (primary) hypertension: Secondary | ICD-10-CM | POA: Diagnosis not present

## 2022-11-11 DIAGNOSIS — R7303 Prediabetes: Secondary | ICD-10-CM | POA: Diagnosis not present

## 2022-11-12 DIAGNOSIS — Z7960 Long term (current) use of unspecified immunomodulators and immunosuppressants: Secondary | ICD-10-CM | POA: Diagnosis not present

## 2022-11-12 DIAGNOSIS — M0579 Rheumatoid arthritis with rheumatoid factor of multiple sites without organ or systems involvement: Secondary | ICD-10-CM | POA: Diagnosis not present

## 2022-11-18 DIAGNOSIS — Z0000 Encounter for general adult medical examination without abnormal findings: Secondary | ICD-10-CM | POA: Diagnosis not present

## 2022-11-18 DIAGNOSIS — Z1331 Encounter for screening for depression: Secondary | ICD-10-CM | POA: Diagnosis not present

## 2022-11-18 DIAGNOSIS — Z6826 Body mass index (BMI) 26.0-26.9, adult: Secondary | ICD-10-CM | POA: Diagnosis not present

## 2022-11-18 DIAGNOSIS — R7303 Prediabetes: Secondary | ICD-10-CM | POA: Diagnosis not present

## 2022-11-18 DIAGNOSIS — M0579 Rheumatoid arthritis with rheumatoid factor of multiple sites without organ or systems involvement: Secondary | ICD-10-CM | POA: Diagnosis not present

## 2022-11-18 DIAGNOSIS — I10 Essential (primary) hypertension: Secondary | ICD-10-CM | POA: Diagnosis not present

## 2023-02-13 DIAGNOSIS — H35373 Puckering of macula, bilateral: Secondary | ICD-10-CM | POA: Diagnosis not present

## 2023-02-13 DIAGNOSIS — H401133 Primary open-angle glaucoma, bilateral, severe stage: Secondary | ICD-10-CM | POA: Diagnosis not present

## 2023-02-13 DIAGNOSIS — H2513 Age-related nuclear cataract, bilateral: Secondary | ICD-10-CM | POA: Diagnosis not present

## 2023-03-17 DIAGNOSIS — M0579 Rheumatoid arthritis with rheumatoid factor of multiple sites without organ or systems involvement: Secondary | ICD-10-CM | POA: Diagnosis not present

## 2023-03-17 DIAGNOSIS — Z7960 Long term (current) use of unspecified immunomodulators and immunosuppressants: Secondary | ICD-10-CM | POA: Diagnosis not present

## 2023-05-14 DIAGNOSIS — I10 Essential (primary) hypertension: Secondary | ICD-10-CM | POA: Diagnosis not present

## 2023-05-14 DIAGNOSIS — R7303 Prediabetes: Secondary | ICD-10-CM | POA: Diagnosis not present

## 2023-05-21 ENCOUNTER — Encounter: Payer: Self-pay | Admitting: Internal Medicine

## 2023-05-21 DIAGNOSIS — M0579 Rheumatoid arthritis with rheumatoid factor of multiple sites without organ or systems involvement: Secondary | ICD-10-CM | POA: Diagnosis not present

## 2023-05-21 DIAGNOSIS — R7303 Prediabetes: Secondary | ICD-10-CM | POA: Diagnosis not present

## 2023-05-21 DIAGNOSIS — Z1231 Encounter for screening mammogram for malignant neoplasm of breast: Secondary | ICD-10-CM | POA: Diagnosis not present

## 2023-05-21 DIAGNOSIS — I10 Essential (primary) hypertension: Secondary | ICD-10-CM | POA: Diagnosis not present

## 2023-05-22 ENCOUNTER — Other Ambulatory Visit: Payer: Self-pay | Admitting: Internal Medicine

## 2023-05-22 DIAGNOSIS — N63 Unspecified lump in unspecified breast: Secondary | ICD-10-CM

## 2023-06-25 DIAGNOSIS — H401133 Primary open-angle glaucoma, bilateral, severe stage: Secondary | ICD-10-CM | POA: Diagnosis not present

## 2023-07-23 DIAGNOSIS — M0579 Rheumatoid arthritis with rheumatoid factor of multiple sites without organ or systems involvement: Secondary | ICD-10-CM | POA: Diagnosis not present

## 2023-07-23 DIAGNOSIS — Z7960 Long term (current) use of unspecified immunomodulators and immunosuppressants: Secondary | ICD-10-CM | POA: Diagnosis not present

## 2023-09-19 ENCOUNTER — Ambulatory Visit
Admission: RE | Admit: 2023-09-19 | Discharge: 2023-09-19 | Disposition: A | Discharge status: Home / Self Care | Payer: Medicare HMO | Source: Ambulatory Visit | Attending: Internal Medicine | Admitting: Internal Medicine

## 2023-09-19 DIAGNOSIS — R9230 Dense breasts, unspecified: Secondary | ICD-10-CM | POA: Insufficient documentation

## 2023-09-19 DIAGNOSIS — N6314 Unspecified lump in the right breast, lower inner quadrant: Secondary | ICD-10-CM | POA: Diagnosis not present

## 2023-09-19 DIAGNOSIS — N6001 Solitary cyst of right breast: Secondary | ICD-10-CM | POA: Diagnosis not present

## 2023-09-19 DIAGNOSIS — R928 Other abnormal and inconclusive findings on diagnostic imaging of breast: Secondary | ICD-10-CM | POA: Diagnosis not present

## 2023-09-19 DIAGNOSIS — R92323 Mammographic fibroglandular density, bilateral breasts: Secondary | ICD-10-CM | POA: Diagnosis not present

## 2023-09-19 DIAGNOSIS — N63 Unspecified lump in unspecified breast: Secondary | ICD-10-CM

## 2023-11-19 DIAGNOSIS — R7303 Prediabetes: Secondary | ICD-10-CM | POA: Diagnosis not present

## 2023-11-19 DIAGNOSIS — Z7960 Long term (current) use of unspecified immunomodulators and immunosuppressants: Secondary | ICD-10-CM | POA: Diagnosis not present

## 2023-11-19 DIAGNOSIS — M0579 Rheumatoid arthritis with rheumatoid factor of multiple sites without organ or systems involvement: Secondary | ICD-10-CM | POA: Diagnosis not present

## 2023-11-19 DIAGNOSIS — I10 Essential (primary) hypertension: Secondary | ICD-10-CM | POA: Diagnosis not present

## 2023-11-26 DIAGNOSIS — Z1331 Encounter for screening for depression: Secondary | ICD-10-CM | POA: Diagnosis not present

## 2023-11-26 DIAGNOSIS — I10 Essential (primary) hypertension: Secondary | ICD-10-CM | POA: Diagnosis not present

## 2023-11-26 DIAGNOSIS — Z0000 Encounter for general adult medical examination without abnormal findings: Secondary | ICD-10-CM | POA: Diagnosis not present

## 2023-11-26 DIAGNOSIS — R7303 Prediabetes: Secondary | ICD-10-CM | POA: Diagnosis not present

## 2023-11-26 DIAGNOSIS — M0579 Rheumatoid arthritis with rheumatoid factor of multiple sites without organ or systems involvement: Secondary | ICD-10-CM | POA: Diagnosis not present
